# Patient Record
Sex: Female | Born: 1945 | Race: White | Hispanic: No | Marital: Married | Smoking: Never smoker
Health system: Southern US, Community
[De-identification: ages and names within clinical notes are randomized; demographics above are authoritative.]

## PROBLEM LIST (undated history)

## (undated) DIAGNOSIS — L409 Psoriasis, unspecified: Secondary | ICD-10-CM

## (undated) DIAGNOSIS — M1990 Unspecified osteoarthritis, unspecified site: Secondary | ICD-10-CM

## (undated) DIAGNOSIS — G2581 Restless legs syndrome: Secondary | ICD-10-CM

## (undated) DIAGNOSIS — I341 Nonrheumatic mitral (valve) prolapse: Secondary | ICD-10-CM

## (undated) DIAGNOSIS — K219 Gastro-esophageal reflux disease without esophagitis: Secondary | ICD-10-CM

## (undated) DIAGNOSIS — F419 Anxiety disorder, unspecified: Secondary | ICD-10-CM

## (undated) DIAGNOSIS — I10 Essential (primary) hypertension: Secondary | ICD-10-CM

## (undated) DIAGNOSIS — N289 Disorder of kidney and ureter, unspecified: Secondary | ICD-10-CM

## (undated) DIAGNOSIS — F329 Major depressive disorder, single episode, unspecified: Secondary | ICD-10-CM

## (undated) DIAGNOSIS — F32A Depression, unspecified: Secondary | ICD-10-CM

## (undated) HISTORY — PX: BACK SURGERY: SHX140

---

## 2011-10-09 DIAGNOSIS — I10 Essential (primary) hypertension: Secondary | ICD-10-CM | POA: Insufficient documentation

## 2016-03-12 DIAGNOSIS — G8929 Other chronic pain: Secondary | ICD-10-CM | POA: Insufficient documentation

## 2016-03-26 ENCOUNTER — Emergency Department
Admission: EM | Admit: 2016-03-26 | Discharge: 2016-03-26 | Disposition: A | Discharge status: Home / Self Care | Payer: Medicare Other | Source: Home / Self Care | Attending: Family Medicine | Admitting: Family Medicine

## 2016-03-26 ENCOUNTER — Encounter: Payer: Self-pay | Admitting: *Deleted

## 2016-03-26 DIAGNOSIS — J069 Acute upper respiratory infection, unspecified: Secondary | ICD-10-CM | POA: Diagnosis not present

## 2016-03-26 DIAGNOSIS — B9789 Other viral agents as the cause of diseases classified elsewhere: Secondary | ICD-10-CM

## 2016-03-26 HISTORY — DX: Nonrheumatic mitral (valve) prolapse: I34.1

## 2016-03-26 HISTORY — DX: Disorder of kidney and ureter, unspecified: N28.9

## 2016-03-26 HISTORY — DX: Unspecified osteoarthritis, unspecified site: M19.90

## 2016-03-26 HISTORY — DX: Restless legs syndrome: G25.81

## 2016-03-26 HISTORY — DX: Major depressive disorder, single episode, unspecified: F32.9

## 2016-03-26 HISTORY — DX: Depression, unspecified: F32.A

## 2016-03-26 HISTORY — DX: Essential (primary) hypertension: I10

## 2016-03-26 HISTORY — DX: Anxiety disorder, unspecified: F41.9

## 2016-03-26 HISTORY — DX: Gastro-esophageal reflux disease without esophagitis: K21.9

## 2016-03-26 HISTORY — DX: Psoriasis, unspecified: L40.9

## 2016-03-26 MED ORDER — DOXYCYCLINE HYCLATE 100 MG PO CAPS: 100.0000 mg | ORAL_CAPSULE | Freq: Two times a day (BID) | ORAL | 0 refills | Status: DC

## 2016-03-26 MED ORDER — BENZONATATE 200 MG PO CAPS: 200.0000 mg | ORAL_CAPSULE | Freq: Every day | ORAL | 0 refills | Status: AC

## 2016-03-26 MED ORDER — FLUTICASONE PROPIONATE 50 MCG/ACT NA SUSP: NASAL | 1 refills | Status: AC

## 2016-03-26 NOTE — ED Triage Notes (Signed)
Patient c/o 3 days of productive cough and nasal congestion. Taken Mucinex otc. Also c/o left groin and leg pain started 8 days ago. She initially went to the ER, x-rays shown arthritis. Given Tramadol. Pain improved, returned past 2 days.

## 2016-03-26 NOTE — ED Provider Notes (Signed)
[facility]    CSN: [id number] Arrival date & time: 03/26/16  1351     History   Chief Complaint Chief Complaint  Patient presents with  . Cough  . Leg Pain    HPI Stacy Gross is a 70 y.o. female.   Patient complains of four day history of typical cold-like symptoms developing over several days, including mild sore throat, sinus congestion,  fatigue, sweats, and cough. Yesterday her ears felt clogged, and last night her cough increased and became more productive. She states that she developed left hip pain one week ago and was evaluated at an ER.  She was prescribed tramadol which helped.  This morning she had recurrent left hip pain.   The history is provided by the patient.    Past Medical History:  Diagnosis Date  . Acid reflux   . Anxiety and depression   . Arthritis   . Hypertension   . Kidney disease   . Mitral valve prolapse   . Psoriasis   . RLS (restless legs syndrome)     There are no active problems to display for this patient.   History reviewed. No pertinent surgical history.  OB History    No data available       Home Medications    Prior to Admission medications   Medication Sig Start Date End Date Taking? Authorizing Provider  ALPRAZolam Prudy Feeler(XANAX) 0.5 MG tablet Take 0.5 mg by mouth at bedtime as needed for anxiety.   Yes Historical Provider, MD  amLODipine (NORVASC) 2.5 MG tablet Take 2.5 mg by mouth daily.   Yes Historical Provider, MD  ARIPiprazole (ABILIFY) 5 MG tablet Take 5 mg by mouth daily.   Yes Historical Provider, MD  buPROPion (WELLBUTRIN XL) 150 MG 24 hr tablet Take 150 mg by mouth daily.   Yes Historical Provider, MD  clindamycin (CLEOCIN) 150 MG capsule Take by mouth 3 (three) times daily.   Yes Historical Provider, MD  Clobetasol Prop Crea-Coal Tar (CLOBETA CREAM EX) Apply topically.   Yes Historical Provider, MD  dicyclomine (BENTYL) 20 MG tablet Take 20 mg by mouth every 6 (six) hours.   Yes Historical Provider,  MD  FERROUS SULFATE PO Take 240 mg by mouth.   Yes Historical Provider, MD  GABAPENTIN PO Take by mouth.   Yes Historical Provider, MD  omeprazole (PRILOSEC) 20 MG capsule Take 20 mg by mouth daily.   Yes Historical Provider, MD  ondansetron (ZOFRAN) 8 MG tablet Take by mouth every 8 (eight) hours as needed for nausea or vomiting.   Yes Historical Provider, MD  sertraline (ZOLOFT) 100 MG tablet Take 100 mg by mouth daily.   Yes Historical Provider, MD  benzonatate (TESSALON) 200 MG capsule Take 1 capsule (200 mg total) by mouth at bedtime. Take as needed for cough 03/26/16   Lattie HawStephen A Medardo Hassing, MD  doxycycline (VIBRAMYCIN) 100 MG capsule Take 1 capsule (100 mg total) by mouth 2 (two) times daily. Take with food (Rx void after 04/03/16) 03/26/16   Lattie HawStephen A Legion Discher, MD  fluticasone Aleda Grana(FLONASE) 50 MCG/ACT nasal spray Place two sprays in each nostril once daily 03/26/16   Lattie HawStephen A Doll Frazee, MD    Family History History reviewed. No pertinent family history.  Social History Social History  Substance Use Topics  . Smoking status: Never Smoker  . Smokeless tobacco: Never Used  . Alcohol use No     Allergies   Ceftriaxone; Ciprofloxacin; Codeine; Lisinopril; and Penicillins   Review of Systems  Review of Systems  + sore throat + cough No pleuritic pain No wheezing + nasal congestion + post-nasal drainage No sinus pain/pressure No itchy/red eyes ? earache No hemoptysis No SOB No fever, + chills/sweats No nausea No vomiting No abdominal pain No diarrhea No urinary symptoms No skin rash + left hip pain + fatigue + myalgias No headache Used OTC meds without relief    Physical Exam Triage Vital Signs ED Triage Vitals  Enc Vitals Group     BP 03/26/16 1458 125/67     Pulse Rate 03/26/16 1458 89     Resp 03/26/16 1458 16     Temp 03/26/16 1458 98.3 F (36.8 C)     Temp Source 03/26/16 1458 Oral     SpO2 03/26/16 1458 95 %     Weight 03/26/16 1459 128 lb (58.1 kg)     Height  --      Head Circumference --      Peak Flow --      Pain Score 03/26/16 1531 7     Pain Loc --      Pain Edu? --      Excl. in GC? --    No data found.   Updated Vital Signs BP 125/67 (BP Location: Left Arm)   Pulse 89   Temp 98.3 F (36.8 C) (Oral)   Resp 16   Wt 128 lb (58.1 kg)   SpO2 95%   Visual Acuity Right Eye Distance:   Left Eye Distance:   Bilateral Distance:    Right Eye Near:   Left Eye Near:    Bilateral Near:     Physical Exam Nursing notes and Vital Signs reviewed. Appearance:  Patient appears stated age, and in no acute distress Eyes:  Pupils are equal, round, and reactive to light and accomodation.  Extraocular movement is intact.  Conjunctivae are not inflamed  Ears:  Canals normal.  Tympanic membranes normal.  Nose:  Mildly congested turbinates.  No sinus tenderness.   Pharynx:  Normal Neck:  Supple.  Tender enlarged posterior/lateral nodes are palpated bilaterally  Lungs:  Clear to auscultation.  Breath sounds are equal.  Moving air well. Heart:  Regular rate and rhythm without murmurs, rubs, or gallops.  Abdomen:  Nontender without masses or hepatosplenomegaly.  Bowel sounds are present.  No CVA or flank tenderness.  Extremities:  No edema.  Left hip:  Relatively good range of motion.  Has pain with active hip flexion.  There is tenderness to palpation over the symphysis pubis. Skin:  No rash present.    UC Treatments / Results  Labs (all labs ordered are listed, but only abnormal results are displayed) Labs Reviewed - No data to display  EKG  EKG Interpretation None       Radiology No results found.  Procedures Procedures (including critical care time)  Medications Ordered in UC Medications - No data to display   Initial Impression / Assessment and Plan / UC Course  I have reviewed the triage vital signs and the nursing notes.  Pertinent labs & imaging results that were available during my care of the patient were reviewed by  me and considered in my medical decision making (see chart for details).  Clinical Course   There is no evidence of bacterial infection today.   Prescription written for Benzonatate [employer]) to take at bedtime for night-time cough.  Suspect left hip pain exacerbation indirectly related to her viral URI Take plain guaifenesin (1200mg  extended release  tabs such as Mucinex) twice daily, with plenty of water, for cough and congestion. Get adequate rest.   May use Afrin nasal spray (or generic oxymetazoline) twice daily for about 5 days and then discontinue.  Also recommend using saline nasal spray several times daily and saline nasal irrigation (AYR is a common brand).  Use Flonase nasal spray each morning after using Afrin nasal spray and saline nasal irrigation. Try warm salt water gargles for sore throat.  Stop all antihistamines for now, and other non-prescription cough/cold preparations. May continue Tramadol for left hip/leg pain. Begin Doxycycline if not improving about one week or if persistent fever develops (Given a prescription to hold, with an expiration date)  Follow-up with family doctor if not improving about10 days.      Final Clinical Impressions(s) / UC Diagnoses   Final diagnoses:  Viral URI with cough    New Prescriptions New Prescriptions   BENZONATATE (TESSALON) 200 MG CAPSULE    Take 1 capsule (200 mg total) by mouth at bedtime. Take as needed for cough   DOXYCYCLINE (VIBRAMYCIN) 100 MG CAPSULE    Take 1 capsule (100 mg total) by mouth 2 (two) times daily. Take with food (Rx void after 04/03/16)   FLUTICASONE (FLONASE) 50 MCG/ACT NASAL SPRAY    Place two sprays in each nostril once daily     Lattie HawStephen A Madiline Saffran, MD 03/28/16 1312

## 2016-03-26 NOTE — Discharge Instructions (Signed)
Take plain guaifenesin (1200mg  extended release tabs such as Mucinex) twice daily, with plenty of water, for cough and congestion. Get adequate rest.   May use Afrin nasal spray (or generic oxymetazoline) twice daily for about 5 days and then discontinue.  Also recommend using saline nasal spray several times daily and saline nasal irrigation (AYR is a common brand).  Use Flonase nasal spray each morning after using Afrin nasal spray and saline nasal irrigation. Try warm salt water gargles for sore throat.  Stop all antihistamines for now, and other non-prescription cough/cold preparations. May continue Tramadol for left hip/leg pain. Begin Doxycycline if not improving about one week or if persistent fever develops   Follow-up with family doctor if not improving about10 days.

## 2016-07-05 DIAGNOSIS — M47812 Spondylosis without myelopathy or radiculopathy, cervical region: Secondary | ICD-10-CM | POA: Insufficient documentation

## 2016-11-11 DIAGNOSIS — R9431 Abnormal electrocardiogram [ECG] [EKG]: Secondary | ICD-10-CM | POA: Insufficient documentation

## 2017-08-28 DIAGNOSIS — K5900 Constipation, unspecified: Secondary | ICD-10-CM | POA: Insufficient documentation

## 2019-06-02 ENCOUNTER — Other Ambulatory Visit: Payer: Self-pay

## 2019-06-02 ENCOUNTER — Encounter: Payer: Self-pay | Admitting: *Deleted

## 2019-06-02 ENCOUNTER — Emergency Department (INDEPENDENT_AMBULATORY_CARE_PROVIDER_SITE_OTHER)
Admission: EM | Admit: 2019-06-02 | Discharge: 2019-06-02 | Disposition: A | Discharge status: Home / Self Care | Payer: Medicare Other | Source: Home / Self Care

## 2019-06-02 ENCOUNTER — Emergency Department (INDEPENDENT_AMBULATORY_CARE_PROVIDER_SITE_OTHER): Payer: Medicare Other

## 2019-06-02 DIAGNOSIS — M25531 Pain in right wrist: Secondary | ICD-10-CM | POA: Diagnosis not present

## 2019-06-02 DIAGNOSIS — M79641 Pain in right hand: Secondary | ICD-10-CM | POA: Diagnosis not present

## 2019-06-02 MED ORDER — PREDNISONE 10 MG PO TABS: ORAL_TABLET | ORAL | 0 refills | Status: DC

## 2019-06-02 MED ORDER — HYDROCODONE-ACETAMINOPHEN 5-325 MG PO TABS: 1.0000 | ORAL_TABLET | ORAL | 0 refills | Status: AC | PRN

## 2019-06-02 MED ORDER — SULFAMETHOXAZOLE-TRIMETHOPRIM 800-160 MG PO TABS: 1.0000 | ORAL_TABLET | Freq: Two times a day (BID) | ORAL | 0 refills | Status: AC

## 2019-06-02 NOTE — ED Triage Notes (Addendum)
Patient c/o right wrist pain x 3 days without injury. Hand, wrist and fingers are swollen and warm to touch. Wearing a brace.

## 2019-06-02 NOTE — Discharge Instructions (Signed)
See Dr. Karie Schwalbe tomorrow or return here for recheck.

## 2019-06-03 NOTE — ED Provider Notes (Signed)
[facility]    CSN: [id number] Arrival date & time: 06/02/19  1532      History   Chief Complaint Chief Complaint  Patient presents with  . Wrist Pain    right    HPI Stacy Gross is a 74 y.o. female.   The history is provided by the patient. No language interpreter was used.  Wrist Pain This is a new problem. The current episode started yesterday. The problem occurs constantly. The problem has been gradually worsening. Nothing aggravates the symptoms. Nothing relieves the symptoms. She has tried nothing for the symptoms. The treatment provided no relief.  Pt complains of swelling and pain to her right wrist.  Pt reports pain is worse with movement,  Pt has had gout in other areas in the past   Past Medical History:  Diagnosis Date  . Acid reflux   . Anxiety and depression   . Arthritis   . Hypertension   . Kidney disease   . Mitral valve prolapse   . Psoriasis   . RLS (restless legs syndrome)     There are no problems to display for this patient.   History reviewed. No pertinent surgical history.  OB History   No obstetric history on file.      Home Medications    Prior to Admission medications   Medication Sig Start Date End Date Taking? Authorizing Provider  ALPRAZolam Prudy Feeler) 0.5 MG tablet Take 0.5 mg by mouth at bedtime as needed for anxiety.    [provider]  amLODipine (NORVASC) 2.5 MG tablet Take 2.5 mg by mouth daily.    [provider]  ARIPiprazole (ABILIFY) 5 MG tablet Take 5 mg by mouth daily.    [provider]  benzonatate (TESSALON) 200 MG capsule Take 1 capsule (200 mg total) by mouth at bedtime. Take as needed for cough 03/26/16   Lattie Haw, MD  buPROPion (WELLBUTRIN XL) 150 MG 24 hr tablet Take 150 mg by mouth daily.    [provider]  clindamycin (CLEOCIN) 150 MG capsule Take by mouth 3 (three) times daily.    [provider]  Clobetasol Prop Crea-Coal Tar (CLOBETA CREAM EX)  Apply topically.    [provider]  dicyclomine (BENTYL) 20 MG tablet Take 20 mg by mouth every 6 (six) hours.    [provider]  doxycycline (VIBRAMYCIN) 100 MG capsule Take 1 capsule (100 mg total) by mouth 2 (two) times daily. Take with food (Rx void after 04/03/16) 03/26/16   Lattie Haw, MD  FERROUS SULFATE PO Take 240 mg by mouth.    [provider]  fluticasone Aleda Grana) 50 MCG/ACT nasal spray Place two sprays in each nostril once daily 03/26/16   Lattie Haw, MD  GABAPENTIN PO Take by mouth.    [provider]  HYDROcodone-acetaminophen (NORCO/VICODIN) 5-325 MG tablet Take 1 tablet by mouth every 4 (four) hours as needed for moderate pain. 06/02/19 06/01/20  Elson Areas, PA-C  omeprazole (PRILOSEC) 20 MG capsule Take 20 mg by mouth daily.    [provider]  ondansetron (ZOFRAN) 8 MG tablet Take by mouth every 8 (eight) hours as needed for nausea or vomiting.    [provider]  predniSONE (DELTASONE) 10 MG tablet 6,5,4,3,2,1 taper 06/02/19   Elson Areas, PA-C  sertraline (ZOLOFT) 100 MG tablet Take 100 mg by mouth daily.    [provider]  sulfamethoxazole-trimethoprim (BACTRIM DS) 800-160 MG tablet Take 1 tablet  by mouth 2 (two) times daily. 06/02/19   Elson Areas, PA-C    Family History History reviewed. No pertinent family history.  Social History Social History   Tobacco Use  . Smoking status: Never Smoker  . Smokeless tobacco: Never Used  Substance Use Topics  . Alcohol use: No  . Drug use: No     Allergies   Ceftriaxone, Ciprofloxacin, Codeine, Lisinopril, and Penicillins   Review of Systems Review of Systems  Musculoskeletal: Positive for joint swelling.  All other systems reviewed and are negative.    Physical Exam Triage Vital Signs ED Triage Vitals  Enc Vitals Group     BP 06/02/19 1601 139/70     Pulse Rate 06/02/19 1601 87     Resp 06/02/19 1601 14     Temp 06/02/19  1601 98.5 F (36.9 C)     Temp Source 06/02/19 1601 Oral     SpO2 06/02/19 1601 96 %     Weight 06/02/19 1602 132 lb (59.9 kg)     Height --      Head Circumference --      Peak Flow --      Pain Score 06/02/19 1602 10     Pain Loc --      Pain Edu? --      Excl. in GC? --    No data found.  Updated Vital Signs BP 139/70 (BP Location: Left Arm)   Pulse 87   Temp 98.5 F (36.9 C) (Oral)   Resp 14   Wt 59.9 kg   SpO2 96%   Visual Acuity Right Eye Distance:   Left Eye Distance:   Bilateral Distance:    Right Eye Near:   Left Eye Near:    Bilateral Near:     Physical Exam Vitals reviewed.  Musculoskeletal:        General: Swelling and tenderness present.     Comments: Swollen tender right wrist, pain with movement, nv and ns intact   Skin:    General: Skin is warm.  Neurological:     General: No focal deficit present.     Mental Status: She is alert.  Psychiatric:        Mood and Affect: Mood normal.      UC Treatments / Results  Labs (all labs ordered are listed, but only abnormal results are displayed) Labs Reviewed - No data to display  EKG   Radiology DG Hand Complete Right  Result Date: 06/02/2019 CLINICAL DATA:  Atraumatic right hand pain and swelling. EXAM: RIGHT HAND - COMPLETE 3+ VIEW COMPARISON:  None. FINDINGS: Normal visualized carpal bones. Normal first metacarpus. Normal second through fifth metacarpi. Chronic deformities are seen involving the distal aspect of the middle phalanx of the third right finger and base of the distal phalanx of the third right finger. The remaining phalanges are normal in appearance. There is no demonstrated Fracture. Normal carpal articulations. Mild degenerative changes seen involving the carpometacarpal [facility]) articulation and interphalangeal joint of the right thumb. Normal metacarpophalangeal (MCP) joint of the right thumb. Normal second through fifth carpometacarpal (CMC) joints. Normal second through fifth  metacarpophalangeal (MCP) joints. Moderate severity degenerative changes seen involving the proximal interphalangeal (PIP) and distal interphalangeal (DIP) joints of the second through fifth fingers. Mild dorsal soft tissue swelling is noted. IMPRESSION: 1. Chronic and degenerative changes, as described above, without evidence of acute osseous abnormality. Electronically Signed   By: Aram Candela M.D.   On: 06/02/2019 17:05  Procedures Procedures (including critical care time)  Medications Ordered in UC Medications - No data to display  Initial Impression / Assessment and Plan / UC Course  I have reviewed the triage vital signs and the nursing notes.  Pertinent labs & imaging results that were available during my care of the patient were reviewed by me and considered in my medical decision making (see chart for details).     MDM  I don't think pt has cellulitis, I suspect gout.  I will treat with prednisone and bactrim.  Pt is advised she needs to be rechecked in 24 hours. I will refer to Dr. Darene Lamer. If he can not see I will recheck here.   Final Clinical Impressions(s) / UC Diagnoses   Final diagnoses:  Acute pain of right wrist     Discharge Instructions     See Dr. Darene Lamer tomorrow or return here for recheck.    ED Prescriptions    Medication Sig Dispense Auth. Provider   predniSONE (DELTASONE) 10 MG tablet 6,5,4,3,2,1 taper 21 tablet Caryl Ada K, PA-C   sulfamethoxazole-trimethoprim (BACTRIM DS) 800-160 MG tablet Take 1 tablet by mouth 2 (two) times daily. 20 tablet Caius Silbernagel K, [place]   HYDROcodone-acetaminophen (NORCO/VICODIN) 5-325 MG tablet Take 1 tablet by mouth every 4 (four) hours as needed for moderate pain. 12 tablet Fransico Meadow, [place]     I have reviewed the PDMP during this encounter.  An After Visit Summary was printed and given to the patient.    Fransico Meadow, [place] 06/03/19 1044

## 2020-02-08 DIAGNOSIS — M1990 Unspecified osteoarthritis, unspecified site: Secondary | ICD-10-CM | POA: Insufficient documentation

## 2020-05-03 ENCOUNTER — Emergency Department (INDEPENDENT_AMBULATORY_CARE_PROVIDER_SITE_OTHER)
Admission: EM | Admit: 2020-05-03 | Discharge: 2020-05-03 | Disposition: A | Discharge status: Home / Self Care | Payer: Medicare Other | Source: Home / Self Care

## 2020-05-03 ENCOUNTER — Other Ambulatory Visit: Payer: Self-pay

## 2020-05-03 DIAGNOSIS — R059 Cough, unspecified: Secondary | ICD-10-CM

## 2020-05-03 DIAGNOSIS — J320 Chronic maxillary sinusitis: Secondary | ICD-10-CM

## 2020-05-03 MED ORDER — DOXYCYCLINE HYCLATE 100 MG PO CAPS: 100.0000 mg | ORAL_CAPSULE | Freq: Two times a day (BID) | ORAL | 0 refills | Status: AC

## 2020-05-03 NOTE — ED Provider Notes (Signed)
[name]    CSN: [id number] Arrival date & time: 05/03/20  1516      History   Chief Complaint Chief Complaint  Patient presents with  . Sore Throat    HPI Stacy Gross is a 74 y.o. female.   Patient complains of sore throat.  Also has some cough productive of yellow-green sputum.  Vaccinated for both Covid and flu.  Had a rapid Covid test 3 days ago at the drugstore which was negative.  HPI  Past Medical History:  Diagnosis Date  . Acid reflux   . Anxiety and depression   . Arthritis   . Hypertension   . Kidney disease   . Mitral valve prolapse   . Psoriasis   . RLS (restless legs syndrome)     There are no problems to display for this patient.   History reviewed. No pertinent surgical history.  OB History   No obstetric history on file.      Home Medications    Prior to Admission medications   Medication Sig Start Date End Date Taking? Authorizing Provider  ALPRAZolam Prudy Feeler) 0.5 MG tablet Take 0.5 mg by mouth at bedtime as needed for anxiety.    [provider]  amLODipine (NORVASC) 2.5 MG tablet Take 2.5 mg by mouth daily.    [provider]  ARIPiprazole (ABILIFY) 5 MG tablet Take 5 mg by mouth daily.    [provider]  benzonatate (TESSALON) 200 MG capsule Take 1 capsule (200 mg total) by mouth at bedtime. Take as needed for cough 03/26/16   Lattie Haw, MD  buPROPion (WELLBUTRIN XL) 150 MG 24 hr tablet Take 150 mg by mouth daily.    [provider]  clindamycin (CLEOCIN) 150 MG capsule Take by mouth 3 (three) times daily.    [provider]  Clobetasol Prop Crea-Coal Tar (CLOBETA CREAM EX) Apply topically.    [provider]  dicyclomine (BENTYL) 20 MG tablet Take 20 mg by mouth every 6 (six) hours.    [provider]  doxycycline (VIBRAMYCIN) 100 MG capsule Take 1 capsule (100 mg total) by mouth 2 (two) times daily. Take with food (Rx void after 04/03/16) 03/26/16    Lattie Haw, MD  FERROUS SULFATE PO Take 240 mg by mouth.    [provider]  fluticasone Aleda Grana) 50 MCG/ACT nasal spray Place two sprays in each nostril once daily 03/26/16   Lattie Haw, MD  GABAPENTIN PO Take by mouth.    [provider]  HYDROcodone-acetaminophen (NORCO/VICODIN) 5-325 MG tablet Take 1 tablet by mouth every 4 (four) hours as needed for moderate pain. 06/02/19 06/01/20  Elson Areas, PA-C  omeprazole (PRILOSEC) 20 MG capsule Take 20 mg by mouth daily.    [provider]  ondansetron (ZOFRAN) 8 MG tablet Take by mouth every 8 (eight) hours as needed for nausea or vomiting.    [provider]  predniSONE (DELTASONE) 10 MG tablet 6,5,4,3,2,1 taper 06/02/19   Elson Areas, PA-C  sertraline (ZOLOFT) 100 MG tablet Take 100 mg by mouth daily.    [provider]  sulfamethoxazole-trimethoprim (BACTRIM DS) 800-160 MG tablet Take 1 tablet by mouth 2 (two) times daily. 06/02/19   Elson Areas, PA-C    Family History History reviewed. No pertinent family history.  Social History Social History   Tobacco Use  . Smoking status: Never Smoker  . Smokeless tobacco: Never Used  Substance Use Topics  . Alcohol  use: No  . Drug use: No     Allergies   Ceftriaxone, Ciprofloxacin, Codeine, Lisinopril, and Penicillins   Review of Systems Review of Systems  HENT: Positive for postnasal drip and sore throat.   Respiratory: Positive for cough.   All other systems reviewed and are negative.    Physical Exam Triage Vital Signs ED Triage Vitals  Enc Vitals Group     BP 05/03/20 1556 (!) 147/71     Pulse Rate 05/03/20 1556 74     Resp 05/03/20 1556 14     Temp 05/03/20 1556 98.3 F (36.8 C)     Temp Source 05/03/20 1556 Oral     SpO2 05/03/20 1556 95 %     Weight 05/03/20 1555 135 lb (61.2 kg)     Height 05/03/20 1555 5\' 1"  (1.549 m)     Head Circumference --      Peak Flow --      Pain Score 05/03/20 1554 0      Pain Loc --      Pain Edu? --      Excl. in GC? --    No data found.  Updated Vital Signs BP (!) 147/71 (BP Location: Left Arm)   Pulse 74   Temp 98.3 F (36.8 C) (Oral)   Resp 14   Ht 5\' 1"  (1.549 m)   Wt 61.2 kg   SpO2 95%   BMI 25.51 kg/m   Visual Acuity Right Eye Distance:   Left Eye Distance:   Bilateral Distance:    Right Eye Near:   Left Eye Near:    Bilateral Near:     Physical Exam Vitals and nursing note reviewed.  Constitutional:      Appearance: She is well-developed.  HENT:     Head: Normocephalic.     Right Ear: Tympanic membrane normal.     Left Ear: Tympanic membrane normal.     Mouth/Throat:     Mouth: Mucous membranes are moist.     Pharynx: Oropharyngeal exudate and posterior oropharyngeal erythema present.  Cardiovascular:     Rate and Rhythm: Normal rate and regular rhythm.  Pulmonary:     Effort: Pulmonary effort is normal.     Breath sounds: Normal breath sounds.  Neurological:     General: No focal deficit present.     Mental Status: She is alert.      UC Treatments / Results  Labs (all labs ordered are listed, but only abnormal results are displayed) Labs Reviewed - No data to display  EKG   Radiology No results found.  Procedures Procedures (including critical care time)  Medications Ordered in UC Medications - No data to display  Initial Impression / Assessment and Plan / UC Course  I have reviewed the triage vital signs and the nursing notes.  Pertinent labs & imaging results that were available during my care of the patient were reviewed by me and considered in my medical decision making (see chart for details).     Sinusitis.  Will check for Covid again per patient request Final Clinical Impressions(s) / UC Diagnoses   Final diagnoses:  None   Discharge Instructions   None    ED Prescriptions    None     PDMP not reviewed this encounter.   05/05/20, MD 05/03/20 [phone]

## 2020-05-03 NOTE — ED Triage Notes (Signed)
Patient presents to Urgent Care with complaints of head "fullness", sore throat and cough since 12/27. Patient reports cough syrup otc and cough drops. Vaccinated for both covid and flu.

## 2020-05-05 LAB — SARS-COV-2 RNA,(COVID-19) QUALITATIVE NAAT: SARS CoV2 RNA: NOT DETECTED

## 2020-06-06 ENCOUNTER — Other Ambulatory Visit: Payer: Self-pay

## 2020-06-06 ENCOUNTER — Encounter: Payer: Self-pay | Admitting: Emergency Medicine

## 2020-06-06 ENCOUNTER — Emergency Department (INDEPENDENT_AMBULATORY_CARE_PROVIDER_SITE_OTHER)
Admission: EM | Admit: 2020-06-06 | Discharge: 2020-06-06 | Disposition: A | Discharge status: Home / Self Care | Payer: Medicare Other | Source: Home / Self Care

## 2020-06-06 DIAGNOSIS — M436 Torticollis: Secondary | ICD-10-CM | POA: Diagnosis not present

## 2020-06-06 MED ORDER — DEXAMETHASONE SODIUM PHOSPHATE 10 MG/ML IJ SOLN
10.0000 mg | Freq: Once | INTRAMUSCULAR | Status: AC
  Administered 2020-06-06: 10 mg via INTRAMUSCULAR

## 2020-06-06 MED ORDER — PREDNISONE 10 MG PO TABS: 20.0000 mg | ORAL_TABLET | Freq: Every day | ORAL | 0 refills | Status: AC

## 2020-06-06 NOTE — ED Triage Notes (Signed)
Pain to left side of neck - hurts to move it since Sunday Denies injury  Soft collar in place - started wearing last night Denies any numbness or tingling

## 2020-06-06 NOTE — Discharge Instructions (Signed)
-  You received a steroid injection here in clinic today.  Start your oral prednisone tomorrow morning with breakfast take 20 mg daily for the next 5 days.  Recommend applications of heat to help with your neck pain.  You also can take Tylenol 500 mg every 6 hours as needed for pain while taking this steroids.

## 2020-06-06 NOTE — ED Provider Notes (Signed)
[facility]    CSN: [id number] Arrival date & time: 06/06/20  1111      History   Chief Complaint Chief Complaint  Patient presents with  . Neck Pain    left    HPI Stacy Gross is a 75 y.o. female.   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  Patient has a history of cervical disc spondylopathy and lumbar stenosis.  She has been followed both by orthopedics and by spine specialist.  She has not taken anything for pain.  She has placed herself in a neck collar.  She reports having problems taking muscle relaxers as they are very sedated.  She has stage III kidney disease and is unable to take NSAIDs due to this reason and history of a prior questionable GI bleed. Current neck pain is localized to the left portion of her neck and she is unable to perform full ranges of motions due to pain.  She reports pain started out very mild with neck stiffness and is now related to pain with any sudden movement. Past Medical History:  Diagnosis Date  . Acid reflux   . Anxiety and depression   . Arthritis   . Hypertension   . Kidney disease   . Mitral valve prolapse   . Psoriasis   . RLS (restless legs syndrome)     Patient Active Problem List   Diagnosis Date Noted  . Osteoarthritis 02/08/2020  . Constipation 08/28/2017  . Abnormal ECG 11/11/2016  . 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 Spondylosis of cervical region without myelopathy or radiculopathy 07/05/2016  . Chronic right shoulder pain 03/12/2016  . Essential hypertension 10/09/2011    History reviewed. No pertinent surgical history.  OB History   No obstetric history on file.      Home Medications    Prior to Admission medications   Medication Sig Start Date End Date Taking? Authorizing Provider  hydrochlorothiazide (HYDRODIURIL) 25 MG tablet TAKE 1  TABLET(25 MG) BY MOUTH EVERY DAY IN THE MORNING 12/10/19  Yes [provider]  losartan (COZAAR) 25 MG tablet Take 1 tablet by mouth 2 (two) times daily. 12/10/19  Yes [provider]  sertraline (ZOLOFT) 100 MG tablet Take 100 mg by mouth daily.   Yes [provider]  ALPRAZolam Prudy Feeler) 0.5 MG tablet Take 0.5 mg by mouth at bedtime as needed for anxiety.    [provider]  amLODipine (NORVASC) 2.5 MG tablet Take 2.5 mg by mouth daily. Patient not taking: Reported on 06/06/2020    [provider]  ARIPiprazole (ABILIFY) 5 MG tablet Take 5 mg by mouth daily.    [provider]  aspirin 81 MG EC tablet Take by mouth.    [provider]  benzonatate (TESSALON) 200 MG capsule Take 1 capsule (200 mg total) by mouth at bedtime. Take as needed for cough 03/26/16   Lattie Haw, MD  buPROPion (WELLBUTRIN XL) 150 MG 24 hr tablet Take 150 mg by mouth daily.    [provider]  clindamycin (CLEOCIN) 150 MG capsule Take by mouth 3 (three) times daily. Patient not taking: Reported on 06/06/2020    [provider]  Clobetasol Prop Crea-Coal Tar (CLOBETA CREAM EX) Apply topically.    [provider]  Cyanocobalamin 5000 MCG TBDP Take by mouth.    [provider]  dicyclomine (BENTYL) 20 MG tablet Take 20 mg by mouth every 6 (six) hours.    [provider]  doxycycline (VIBRAMYCIN) 100 MG capsule Take 1 capsule (100 mg total) by mouth 2 (two) times daily. Take with food (Rx void after 04/03/16) 05/03/20   Frederica Kuster, MD  FERROUS SULFATE PO Take 240 mg by mouth.    [provider]  fluticasone Aleda Grana) 50 MCG/ACT nasal spray Place two sprays in each nostril once daily 03/26/16   Lattie Haw, MD  GABAPENTIN PO Take by mouth.    [provider]  omeprazole (PRILOSEC) 20 MG capsule Take 20 mg by mouth daily.    [provider]  ondansetron (ZOFRAN) 8 MG tablet Take by mouth  every 8 (eight) hours as needed for nausea or vomiting. Patient not taking: Reported on 06/06/2020    [provider]  predniSONE (DELTASONE) 10 MG tablet 6,5,4,3,2,1 taper Patient not taking: Reported on 06/06/2020 06/02/19   Elson Areas, PA-C  sulfamethoxazole-trimethoprim (BACTRIM DS) 800-160 MG tablet Take 1 tablet by mouth 2 (two) times daily. Patient not taking: Reported on 06/06/2020 06/02/19   Osie Cheeks    Family History Family History  Problem Relation Age of Onset  . Stroke Mother   . Healthy Father   . Healthy Sister   . Healthy Brother   . Diabetes Sister   . Healthy Sister     Social History Social History   Tobacco Use  . Smoking status: Never Smoker  . Smokeless tobacco: Never Used  Substance Use Topics  . Alcohol use: No  . Drug use: No     Allergies   Amlodipine, Ceftriaxone, Ciprofloxacin, Codeine, Lisinopril, and Penicillins   Review of Systems Review of Systems Pertinent negatives listed in HPI   Physical Exam Triage Vital Signs ED Triage Vitals  Enc Vitals Group     BP 06/06/20 1138 (!) 179/82     Pulse Rate 06/06/20 1138 93     Resp 06/06/20 1138 16     Temp 06/06/20 1138 99.4 F (37.4 C)     Temp Source 06/06/20 1138 Oral     SpO2 06/06/20 1138 96 %     Weight 06/06/20 1143 134 lb 7.7 oz (61 kg)     Height 06/06/20 1143 5\' 1"  (1.549 m)     Head Circumference --      Peak Flow --      Pain Score 06/06/20 1142 9     Pain Loc --      Pain Edu? --      Excl. in GC? --    No data found.  Updated Vital Signs BP (!) 179/82 (BP Location: Right Arm)   Pulse 93   Temp 99.4 F (37.4 C) (Oral)   Resp 16   Ht 5\' 1"  (1.549 m)   Wt 134 lb 7.7 oz (61 kg)   SpO2 96%   BMI 25.41 kg/m   Visual Acuity Right Eye Distance:   Left Eye Distance:   Bilateral Distance:    Right Eye Near:   Left Eye Near:    Bilateral Near:     Physical Exam Constitutional:      Comments: Chronically ill-appearing  Eyes:      Extraocular Movements: Extraocular movements intact.     Pupils: Pupils are equal, round, and reactive to light.  Cardiovascular:     Rate and Rhythm: Normal rate.  Pulmonary:     Effort: Pulmonary effort  is normal.     Breath sounds: Normal breath sounds.  Musculoskeletal:     Cervical back: Rigidity and tenderness present.  Lymphadenopathy:     Cervical: No cervical adenopathy.  Skin:    Capillary Refill: Capillary refill takes less than 2 seconds.  Neurological:     Mental Status: She is oriented to person, place, and time.      UC Treatments / Results  Labs (all labs ordered are listed, but only abnormal results are displayed) Labs Reviewed - No data to display  EKG   Radiology No results found.  Procedures Procedures (including critical care time)  Medications Ordered in UC Medications - No data to display  Initial Impression / Assessment and Plan / UC Course  I have reviewed the triage vital signs and the nursing notes.  Pertinent labs & imaging results that were available during my care of the patient were reviewed by me and considered in my medical decision making (see chart for details).     Acute torticollis, patient with a history of chronic degenerative neck and degenerative spine disease presents today with 2 days of neck stiffness and pain.  Patient also has stage III renal disease and a history of a mild GI bleeding therefore unable to give any NSAIDs. Decadron IM given here in clinic.  Patient will start oral low-dose of prednisone tomorrow.  Patient cannot tolerate any muscle relaxers therefore advised to take Tylenol along with the prednisone as needed for pain as well as warm heat applications.  Patient advised to follow-up with [facility] who was previously following her if symptoms worsen or do not improve Final Clinical Impressions(s) / UC Diagnoses   Final diagnoses:  Torticollis, acute     Discharge Instructions     -You received a  steroid injection here in clinic today.  Start your oral prednisone tomorrow morning with breakfast take 20 mg daily for the next 5 days.  Recommend applications of heat to help with your neck pain.  You also can take Tylenol 500 mg every 6 hours as needed for pain while taking this steroids.    ED Prescriptions    Medication Sig Dispense Auth. Provider   predniSONE (DELTASONE) 10 MG tablet Take 2 tablets (20 mg total) by mouth daily with breakfast for 5 days. 10 tablet Bing Neighbors, FNP     PDMP not reviewed this encounter.   Bing Neighbors, FNP 06/06/20 1224

## 2021-01-09 ENCOUNTER — Other Ambulatory Visit: Payer: Self-pay

## 2021-01-09 ENCOUNTER — Emergency Department
Admission: RE | Admit: 2021-01-09 | Discharge: 2021-01-09 | Disposition: A | Discharge status: Home / Self Care | Payer: Medicare Other | Source: Ambulatory Visit

## 2021-01-09 VITALS — BP 136/78 | HR 85 | Temp 98.5°F | Resp 20 | Ht 61.5 in | Wt 128.0 lb

## 2021-01-09 DIAGNOSIS — U071 COVID-19: Secondary | ICD-10-CM

## 2021-01-09 LAB — POC SARS CORONAVIRUS 2 AG -  ED: SARS Coronavirus 2 Ag: POSITIVE — AB

## 2021-01-09 MED ORDER — MOLNUPIRAVIR EUA 200MG CAPSULE: 4.0000 | ORAL_CAPSULE | Freq: Two times a day (BID) | ORAL | 0 refills | Status: AC

## 2021-01-09 NOTE — ED Triage Notes (Signed)
Pt presents to Urgent Care with c/o nasal congestion, R otalgia, and generalized body aches x 3 days. Has not done a home COVID test; vaccinated.

## 2021-01-09 NOTE — Discharge Instructions (Addendum)
Advised/instructed patient to take medication as directed with food to completion.  Encourage patient increase daily water intake while taking this medication.  Advised/encouraged patient to self quarantine for 10 days or until Saturday, 01/20/2021.  Advised patient if asymptomatic may self quarantine for 5 days or until Monday, 01/15/2021.

## 2021-01-09 NOTE — ED Provider Notes (Signed)
[name]    CSN: [id number] Arrival date & time: 01/09/21  1550      History   Chief Complaint Chief Complaint  Patient presents with   Nasal Congestion   Otalgia   Generalized Body Aches    HPI Stacy Gross is a 75 y.o. female.   HPI 75 year old female presents with nasal congestion, right otalgia and generalized body aches x 3 days.  Patient is vaccinated for COVID-19.  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      Home Medications    Prior to Admission medications   Medication Sig Start Date End Date Taking? Authorizing Provider  molnupiravir EUA 200 mg CAPS Take 4 capsules (800 mg total) by mouth 2 (two) times daily for 5 days. 01/09/21 01/14/21 Yes Trevor Iha, FNP  ALPRAZolam Prudy Feeler) 0.5 MG tablet Take 0.5 mg by mouth at bedtime as needed for anxiety.    [provider]  amLODipine (NORVASC) 2.5 MG tablet Take 2.5 mg by mouth daily. Patient not taking: No sig reported    [provider]  ARIPiprazole (ABILIFY) 5 MG tablet Take 5 mg by mouth daily.    [provider]  aspirin 81 MG EC tablet Take by mouth.    [provider]  benzonatate (TESSALON) 200 MG capsule Take 1 capsule (200 mg total) by mouth at bedtime. Take as needed for cough 03/26/16   Lattie Haw, MD  buPROPion (WELLBUTRIN XL)  150 MG 24 hr tablet Take 150 mg by mouth daily.    [provider]  clindamycin (CLEOCIN) 150 MG capsule Take by mouth 3 (three) times daily. Patient not taking: No sig reported    [provider]  Clobetasol Prop Crea-Coal Tar (CLOBETA CREAM EX) Apply topically.    [provider]  Cyanocobalamin 5000 MCG TBDP Take by mouth.    [provider]  dicyclomine (BENTYL) 20 MG tablet Take 20 mg by mouth every 6 (six) hours.    [provider]  doxycycline (VIBRAMYCIN) 100 MG capsule Take 1 capsule (100 mg total) by mouth 2 (two) times daily. Take with food (Rx void after 04/03/16) 05/03/20   Frederica Kuster, MD  FERROUS SULFATE PO Take 240 mg by mouth.    [provider]  fluticasone Aleda Grana) 50 MCG/ACT nasal spray Place two sprays in each nostril once daily 03/26/16   Lattie Haw, MD  GABAPENTIN PO Take by mouth.    [provider]  hydrochlorothiazide (HYDRODIURIL) 25 MG tablet TAKE 1 TABLET(25 MG) BY MOUTH EVERY DAY IN THE MORNING 12/10/19   [provider]  losartan (COZAAR) 25 MG tablet Take 1 tablet by mouth 2 (two) times daily. 12/10/19   [provider]  omeprazole (PRILOSEC) 20 MG capsule Take  20 mg by mouth daily.    [provider]  ondansetron (ZOFRAN) 8 MG tablet Take by mouth every 8 (eight) hours as needed for nausea or vomiting. Patient not taking: No sig reported    [provider]  sertraline (ZOLOFT) 100 MG tablet Take 100 mg by mouth daily.    [provider]  sulfamethoxazole-trimethoprim (BACTRIM DS) 800-160 MG tablet Take 1 tablet by mouth 2 (two) times daily. Patient not taking: No sig reported 06/02/19   Elson Areas, PA-C    Family History Family History  Problem Relation Age of Onset   Stroke Mother    Suicidality Father    Healthy Sister    Diabetes Sister    Healthy Sister    Healthy Brother     Social History Social History   Tobacco Use    Smoking status: Never   Smokeless tobacco: Never  Vaping Use   Vaping Use: Never used  Substance Use Topics   Alcohol use: No   Drug use: No     Allergies   Amlodipine, Ceftriaxone, Ciprofloxacin, Codeine, Lisinopril, and Penicillins   Review of Systems Review of Systems  HENT:  Positive for congestion and ear pain.   Musculoskeletal:  Positive for myalgias.  All other systems reviewed and are negative.   Physical Exam Triage Vital Signs ED Triage Vitals  Enc Vitals Group     BP 01/09/21 1616 136/78     Pulse Rate 01/09/21 1616 85     Resp 01/09/21 1616 20     Temp 01/09/21 1616 98.5 F (36.9 C)     Temp Source 01/09/21 1616 Oral     SpO2 01/09/21 1616 97 %     Weight 01/09/21 1610 128 lb (58.1 kg)     Height 01/09/21 1610 5' 1.5" (1.562 m)     Head Circumference --      Peak Flow --      Pain Score 01/09/21 1609 0     Pain Loc --      Pain Edu? --      Excl. in GC? --    No data found.  Updated Vital Signs BP 136/78 (BP Location: Right Arm)   Pulse 85   Temp 98.5 F (36.9 C) (Oral)   Resp 20   Ht 5' 1.5" (1.562 m)   Wt 128 lb (58.1 kg)   SpO2 97%   BMI 23.79 kg/m      Physical Exam Vitals and nursing note reviewed.  Constitutional:      General: She is not in acute distress.    Appearance: Normal appearance. She is normal weight. She is ill-appearing.  HENT:     Head: Normocephalic and atraumatic.     Right Ear: Tympanic membrane, ear canal and external ear normal.     Left Ear: Tympanic membrane, ear canal and external ear normal.     Mouth/Throat:     Mouth: Mucous membranes are moist.     Pharynx: Oropharynx is clear.  Eyes:     Extraocular Movements: Extraocular movements intact.     Conjunctiva/sclera: Conjunctivae normal.     Pupils: Pupils are equal, round, and reactive to light.  Cardiovascular:     Rate and Rhythm: Normal rate and regular rhythm.     Pulses: Normal pulses.     Heart sounds: Normal heart sounds.  Pulmonary:      Effort: Pulmonary effort is normal.     Breath sounds: Normal breath sounds. No stridor.  No wheezing, rhonchi or rales.  Musculoskeletal:        General: Normal range of motion.     Cervical back: Normal range of motion and neck supple. Tenderness present.  Lymphadenopathy:     Cervical: Cervical adenopathy present.  Skin:    General: Skin is warm and dry.  Neurological:     General: No focal deficit present.     Mental Status: She is alert and oriented to person, place, and time. Mental status is at baseline.  Psychiatric:        Mood and Affect: Mood normal.        Behavior: Behavior normal.        Thought Content: Thought content normal.     UC Treatments / Results  Labs (all labs ordered are listed, but only abnormal results are displayed) Labs Reviewed  POC SARS CORONAVIRUS 2 AG -  ED - Abnormal; Notable for the following components:      Result Value   SARS Coronavirus 2 Ag Positive (*)    All other components within normal limits    EKG   Radiology No results found.  Procedures Procedures (including critical care time)  Medications Ordered in UC Medications - No data to display  Initial Impression / Assessment and Plan / UC Course  I have reviewed the triage vital signs and the nursing notes.  Pertinent labs & imaging results that were available during my care of the patient were reviewed by me and considered in my medical decision making (see chart for details).     MDM: 1.  COVID-19-Rx'd Molnupiravir; Advised/instructed patient to take medication as directed with food to completion.  Encourage patient increase daily water intake while taking this medication.  Advised/encouraged patient to self quarantine for 10 days or until Saturday, 01/20/2021.  Advised patient if asymptomatic may self quarantine for 5 days or until Monday, 01/15/2021.  Patient discharged home, hemodynamically stable. Final Clinical Impressions(s) / UC Diagnoses   Final diagnoses:   COVID-19     Discharge Instructions      Advised/instructed patient to take medication as directed with food to completion.  Encourage patient increase daily water intake while taking this medication.  Advised/encouraged patient to self quarantine for 10 days or until Saturday, 01/20/2021.  Advised patient if asymptomatic may self quarantine for 5 days or until Monday, 01/15/2021.     ED Prescriptions     Medication Sig Dispense Auth. Provider   molnupiravir EUA 200 mg CAPS Take 4 capsules (800 mg total) by mouth 2 (two) times daily for 5 days. 40 capsule Trevor Iha, FNP      PDMP not reviewed this encounter.   Trevor Iha, FNP 01/09/21 1713

## 2021-02-04 IMAGING — DX DG HAND COMPLETE 3+V*R*
3 series · 3 of 3 positions shown · non-contrast
Comparison: None.

CLINICAL DATA: Atraumatic right hand pain and swelling.

EXAM:
RIGHT HAND - COMPLETE 3+ VIEW

[hand pa]
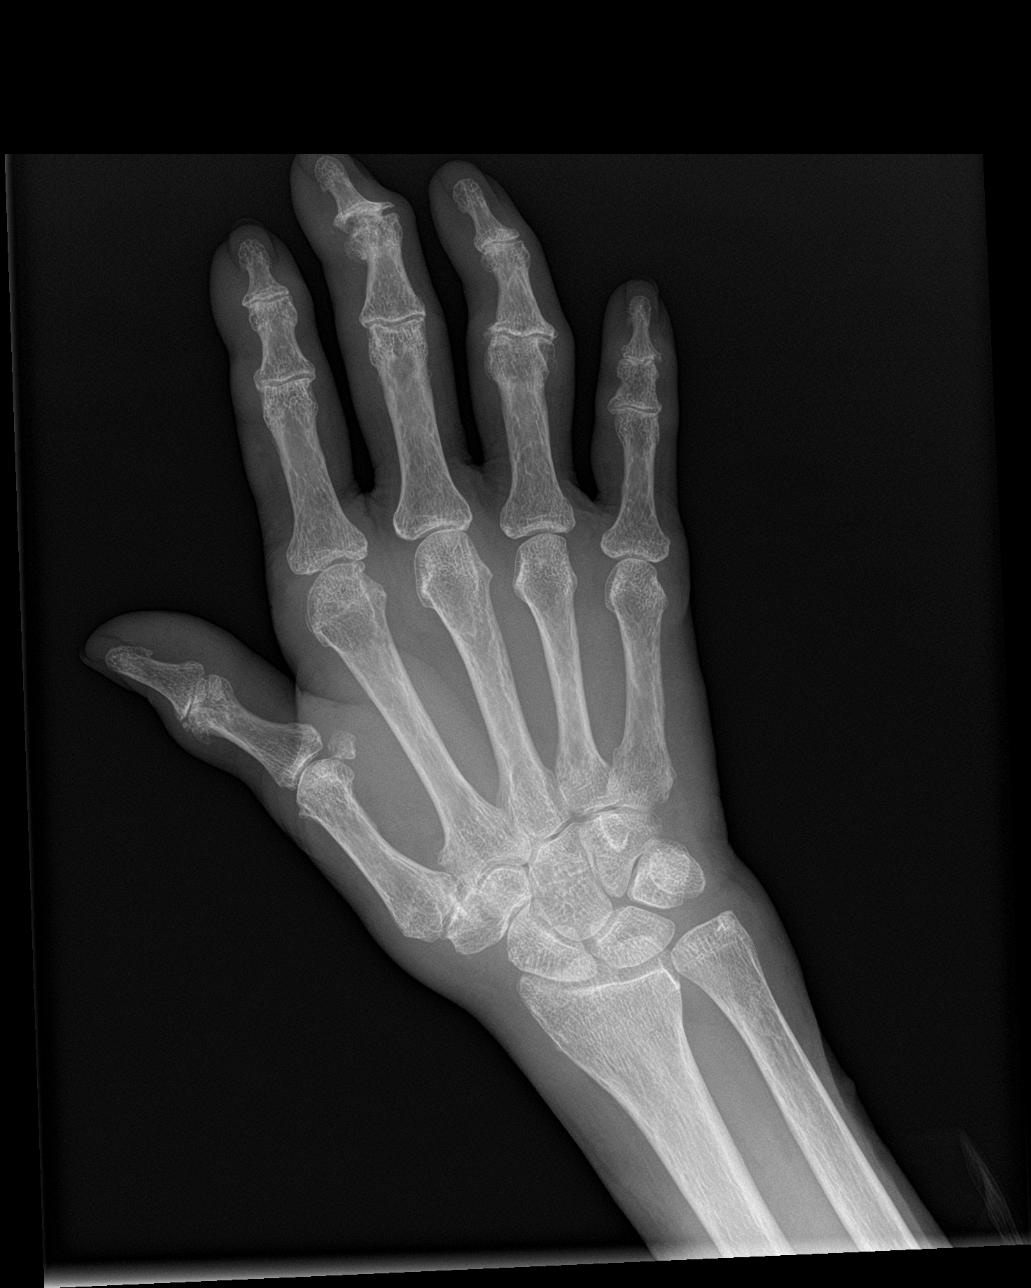

[hand obl]
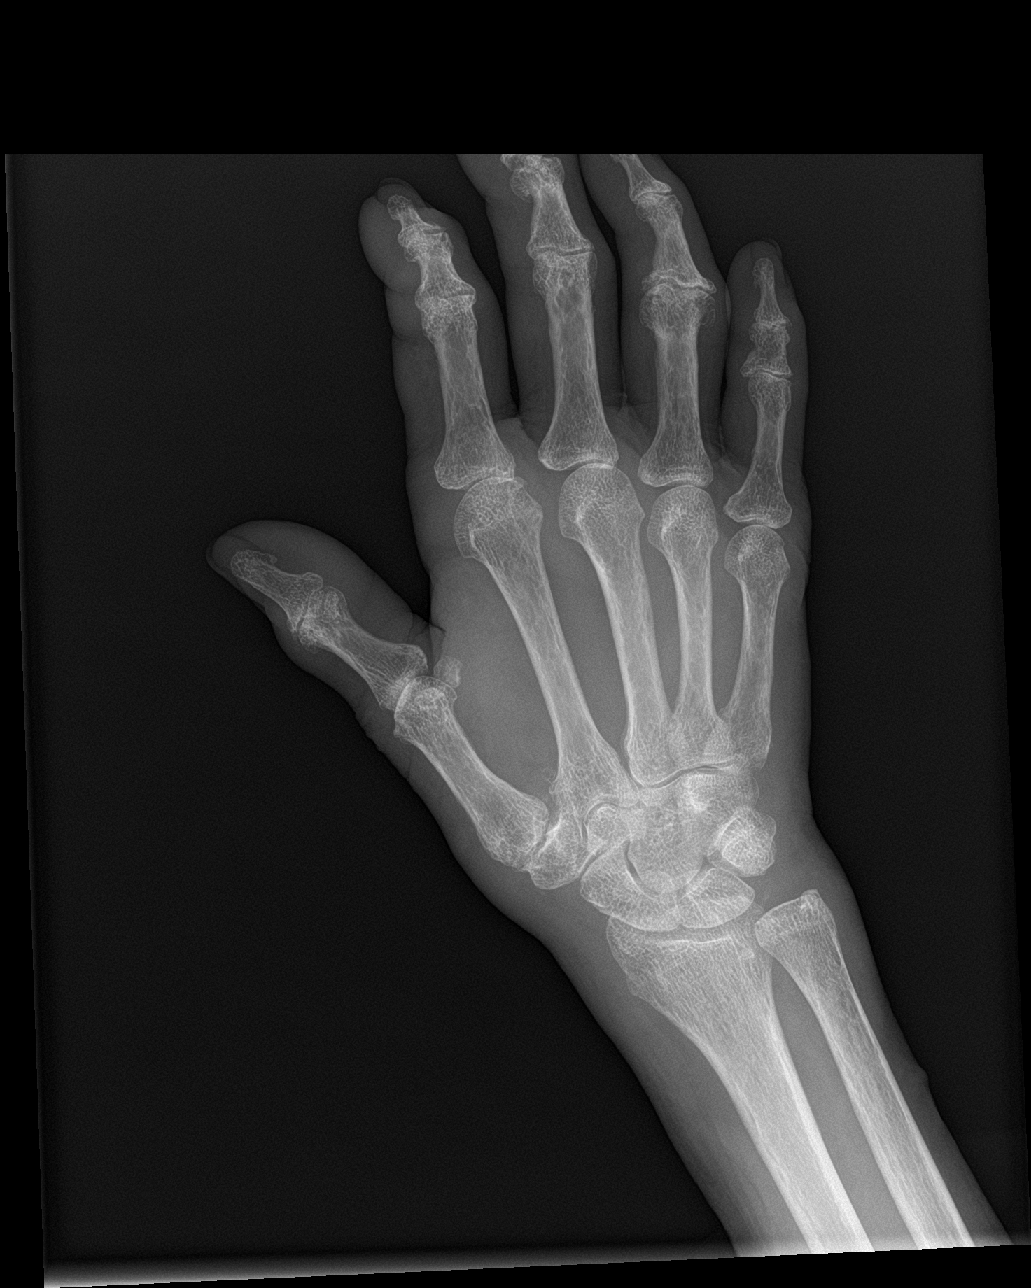

[hand lat]
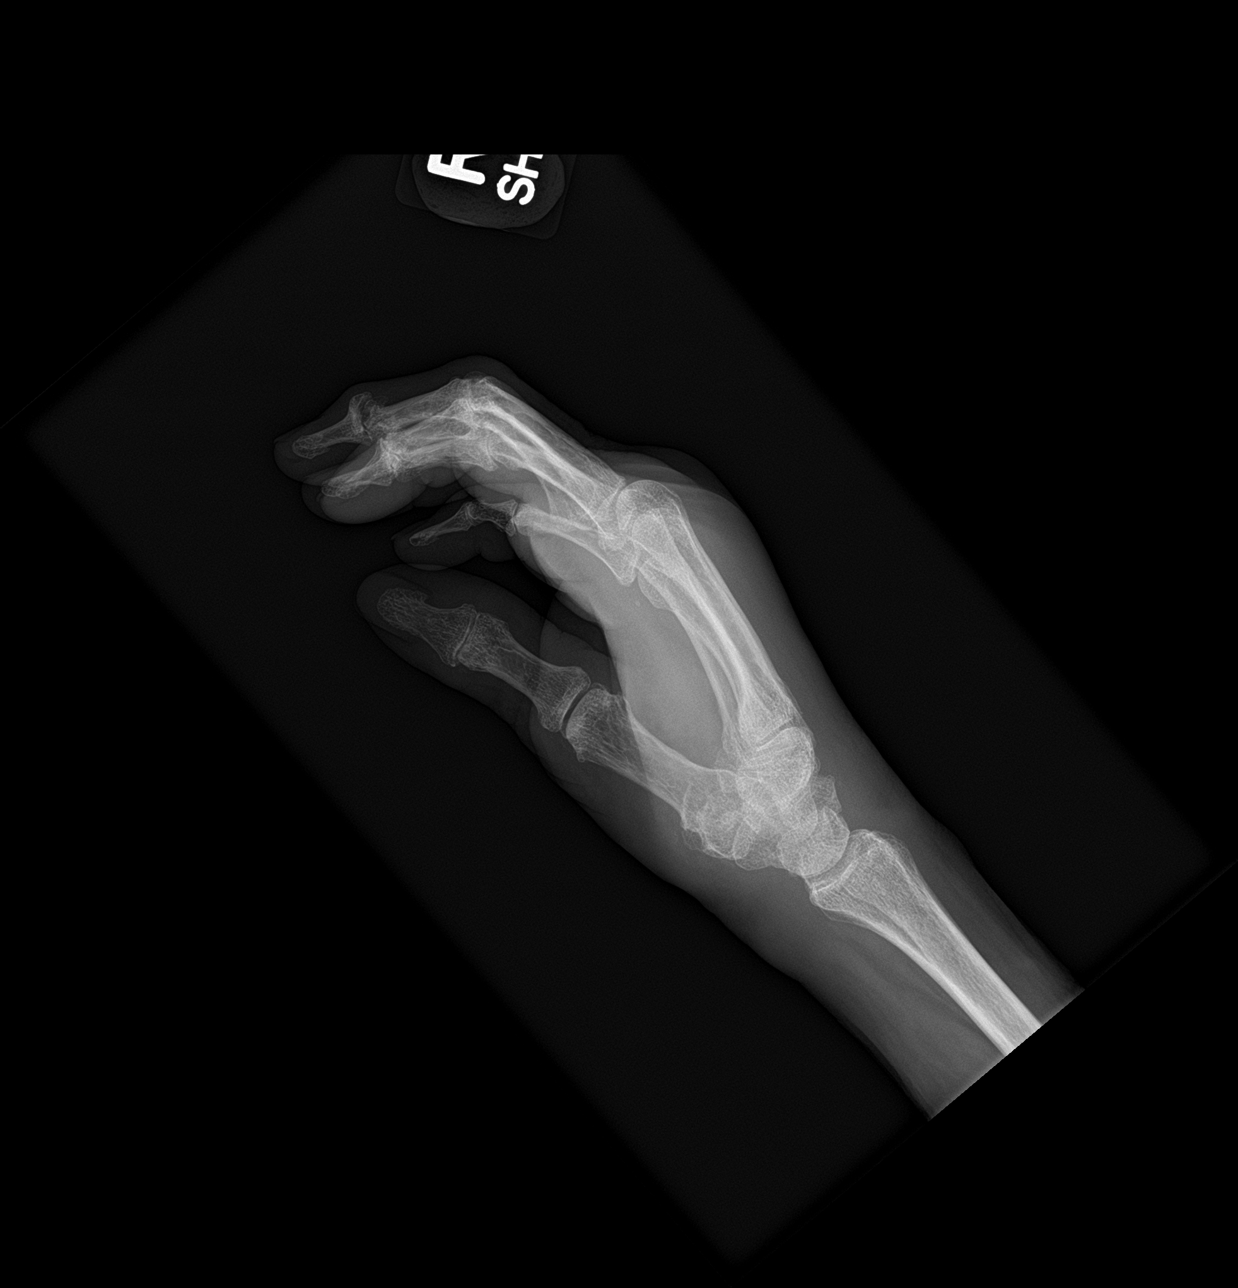

[3 of 3 positions shown; findings below may reference images not displayed]

FINDINGS: Normal visualized carpal bones. Normal first metacarpus. Normal
second through fifth metacarpi.

Chronic deformities are seen involving the distal aspect of the
middle phalanx of the third right finger and base of the distal
phalanx of the third right finger. The remaining phalanges are
normal in appearance.

There is no demonstrated

Fracture.
Normal carpal articulations.
Mild degenerative changes seen involving the carpometacarpal (CMC)
articulation and interphalangeal joint of the right thumb. Normal
metacarpophalangeal (MCP) joint of the right thumb.
Normal second through fifth carpometacarpal (CMC) joints. Normal
second through fifth metacarpophalangeal (MCP) joints. Moderate
severity degenerative changes seen involving the proximal
interphalangeal (PIP) and distal interphalangeal (DIP) joints of the
second through fifth fingers.

Mild dorsal soft tissue swelling is noted.
IMPRESSION: 1. Chronic and degenerative changes, as described above, without
evidence of acute osseous abnormality.

## 2021-05-09 ENCOUNTER — Emergency Department (INDEPENDENT_AMBULATORY_CARE_PROVIDER_SITE_OTHER): Payer: Medicare Other

## 2021-05-09 ENCOUNTER — Emergency Department
Admission: EM | Admit: 2021-05-09 | Discharge: 2021-05-09 | Disposition: A | Discharge status: Home / Self Care | Payer: Medicare Other | Source: Home / Self Care

## 2021-05-09 DIAGNOSIS — M79662 Pain in left lower leg: Secondary | ICD-10-CM

## 2021-05-09 DIAGNOSIS — M25572 Pain in left ankle and joints of left foot: Secondary | ICD-10-CM | POA: Diagnosis not present

## 2021-05-09 DIAGNOSIS — M25472 Effusion, left ankle: Secondary | ICD-10-CM

## 2021-05-09 DIAGNOSIS — M7989 Other specified soft tissue disorders: Secondary | ICD-10-CM

## 2021-05-09 NOTE — ED Notes (Signed)
Report for lower leg ultrasound called to pt. Pt reports she is currently seeing a vascular specialist now, and he is going to review the report. Also advised to f/u with her PCP per M. Ave Filter, FNP.

## 2021-05-09 NOTE — ED Provider Notes (Signed)
[name]    CSN: [id number] Arrival date & time: 05/09/21  1115      History   Chief Complaint Chief Complaint  Patient presents with   Leg Swelling   Ankle Pain    HPI Jarae Nemmers is a 76 y.o. female.   HPI 75 year old female presents with left lower leg/ankle swelling for 3 weeks.  Patient reports pedal edema has been normal for her.  PMH significant for restless leg syndrome, hypertension, CKD, claudication, and mitral valve prolapse.  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      Home Medications    Prior to Admission medications   Medication Sig Start Date End Date Taking? Authorizing Provider  ALPRAZolam Prudy Feeler) 0.5 MG tablet Take 0.5 mg by mouth at bedtime as needed for anxiety.    [provider]  amLODipine (NORVASC) 2.5 MG tablet Take 2.5 mg by mouth daily. Patient not taking: Reported on 06/06/2020    [provider]  ARIPiprazole (ABILIFY) 5 MG tablet Take 5 mg by mouth daily.    [provider]  aspirin 81 MG EC tablet Take by mouth.    [provider]  benzonatate (TESSALON) 200 MG capsule Take 1 capsule (200 mg total) by mouth at bedtime. Take as needed for cough 03/26/16   Lattie Haw, MD  buPROPion (WELLBUTRIN XL) 150 MG 24 hr tablet Take 150 mg by mouth daily.    [provider]  clindamycin (CLEOCIN) 150 MG capsule Take by mouth 3 (three) times daily. Patient not taking: Reported on 06/06/2020    [provider]  Clobetasol Prop Crea-Coal Tar (CLOBETA CREAM EX) Apply topically.    [provider]  Cyanocobalamin 5000 MCG TBDP Take by mouth.    [provider]  dicyclomine (BENTYL) 20 MG tablet Take 20 mg by mouth every 6 (six) hours.    [provider]  doxycycline (VIBRAMYCIN) 100 MG capsule Take 1 capsule (100 mg total) by mouth 2 (two) times daily. Take with food (Rx void after 04/03/16) 05/03/20   Frederica Kuster, MD  FERROUS SULFATE PO Take 240 mg by mouth.    [provider]  fluticasone Aleda Grana) 50 MCG/ACT nasal spray Place two sprays in each nostril once daily 03/26/16   Lattie Haw, MD  GABAPENTIN PO Take by mouth.    [provider]  hydrochlorothiazide (HYDRODIURIL) 25 MG tablet TAKE 1 TABLET(25 MG) BY MOUTH EVERY DAY IN THE MORNING 12/10/19   [provider]  losartan (COZAAR) 25 MG tablet Take 1 tablet by mouth 2 (two) times daily. 12/10/19   [provider]  omeprazole (PRILOSEC) 20 MG capsule Take 20 mg by mouth daily.    [provider]  ondansetron (ZOFRAN) 8 MG tablet  Take by mouth every 8 (eight) hours as needed for nausea or vomiting. Patient not taking: Reported on 06/06/2020    [provider]  sertraline (ZOLOFT) 100 MG tablet Take 100 mg by mouth daily.    [provider]  sulfamethoxazole-trimethoprim (BACTRIM DS) 800-160 MG tablet Take 1 tablet by mouth 2 (two) times daily. Patient not taking: Reported on 06/06/2020 06/02/19   Osie CheeksSofia, Leslie K, PA-C    Family History Family History  Problem Relation Age of Onset   Stroke Mother    Suicidality Father    Healthy Sister    Diabetes Sister    Healthy Sister    Healthy Brother     Social History Social History   Tobacco Use   Smoking status: Never   Smokeless tobacco: Never   Vaping Use   Vaping Use: Never used  Substance Use Topics   Alcohol use: No   Drug use: No     Allergies   Amlodipine, Ceftriaxone, Ciprofloxacin, Codeine, Lisinopril, and Penicillins   Review of Systems Review of Systems  Cardiovascular:  Positive for leg swelling.  All other systems reviewed and are negative.   Physical Exam Triage Vital Signs ED Triage Vitals  Enc Vitals Group     BP 05/09/21 1238 (!) 153/77     Pulse Rate 05/09/21 1238 76     Resp 05/09/21 1238 20     Temp 05/09/21 1238 98 F (36.7 C)     Temp Source 05/09/21 1238 Oral     SpO2 05/09/21 1238 97 %     Weight 05/09/21 1234 134 lb (60.8 kg)     Height 05/09/21 1234 5' 1.5" (1.562 m)     Head Circumference --      Peak Flow --      Pain Score 05/09/21 1232 0     Pain Loc --      Pain Edu? --      Excl. in GC? --    No data found.  Updated Vital Signs BP (!) 153/77 (BP Location: Right Arm)    Pulse 76    Temp 98 F (36.7 C) (Oral)    Resp 20    Ht 5' 1.5" (1.562 m)    Wt 134 lb (60.8 kg)    SpO2 97%    BMI 24.91 kg/m    Physical Exam Vitals and nursing note reviewed.  Constitutional:      General: She is not in acute distress.    Appearance: Normal appearance. She is normal weight. She is not ill-appearing.  HENT:     Head: Normocephalic and atraumatic.     Mouth/Throat:     Mouth: Mucous membranes are moist.     Pharynx: Oropharynx is clear.  Eyes:     Extraocular Movements: Extraocular movements intact.     Conjunctiva/sclera: Conjunctivae normal.     Pupils: Pupils are equal, round, and reactive to light.  Cardiovascular:     Rate and Rhythm: Normal rate and regular rhythm.     Pulses: Normal pulses.     Heart sounds: Normal heart sounds. No murmur heard.   No friction rub. No gallop.  Pulmonary:     Effort: Pulmonary effort is normal.     Breath sounds: Normal breath sounds. No wheezing or rales.  Musculoskeletal:        General: Swelling present.     Comments: Left lower  leg/left ankle (medial aspect): Erythematous, warm to the touch, moderate soft tissue swelling, numerous varicosities  noted; right mid tib circumference 23.0 cm; left mid tib circumference 26.5 cm; PT/DP pulses are palpable  Neurological:     General: No focal deficit present.     Mental Status: She is alert and oriented to person, place, and time. Mental status is at baseline.     UC Treatments / Results  Labs (all labs ordered are listed, but only abnormal results are displayed) Labs Reviewed - No data to display  EKG   Radiology DG Ankle Complete Left  Result Date: 05/09/2021 CLINICAL DATA:  Left ankle pain and swelling.  No injury. EXAM: LEFT ANKLE COMPLETE - 3+ VIEW COMPARISON:  None. FINDINGS: No acute fracture or dislocation. Small well corticated ossific density adjacent to the medial malleolus, likely related to old trauma. The ankle mortise is symmetric. The talar dome is intact. Joint spaces are preserved. Bone mineralization is normal. Small plantar and Achilles enthesophytes. Diffuse soft tissue swelling. IMPRESSION: 1. Diffuse soft tissue swelling. No acute osseous abnormality. Electronically Signed   By: Obie Dredge M.D.   On: 05/09/2021 14:01   US Venous Img Lower Unilateral Left  Result Date: 05/09/2021 CLINICAL DATA:  Left lower extremity pain and swelling for the past 3 weeks. Evaluate for DVT. EXAM: LEFT LOWER EXTREMITY VENOUS DOPPLER ULTRASOUND TECHNIQUE: Gray-scale sonography with graded compression, as well as color Doppler and duplex ultrasound were performed to evaluate the lower extremity deep venous systems from the level of the common femoral vein and including the common femoral, femoral, profunda femoral, popliteal and calf veins including the posterior tibial, peroneal and gastrocnemius veins when visible. The superficial great saphenous vein was also interrogated. Spectral Doppler was utilized to evaluate flow at rest and with distal augmentation maneuvers in  the common femoral, femoral and popliteal veins. COMPARISON:  None. FINDINGS: Contralateral Common Femoral Vein: Respiratory phasicity is normal and symmetric with the symptomatic side. No evidence of thrombus. Normal compressibility. Common Femoral Vein: No evidence of thrombus. Normal compressibility, respiratory phasicity and response to augmentation. Saphenofemoral Junction: No evidence of thrombus. Normal compressibility and flow on color Doppler imaging. Profunda Femoral Vein: No evidence of thrombus. Normal compressibility and flow on color Doppler imaging. Femoral Vein: No evidence of thrombus. Normal compressibility, respiratory phasicity and response to augmentation. Popliteal Vein: No evidence of thrombus. Normal compressibility, respiratory phasicity and response to augmentation. Calf Veins: No evidence of thrombus. Normal compressibility and flow on color Doppler imaging. Superficial Great Saphenous Vein: No evidence of thrombus. Normal compressibility. Venous Reflux:  None. Other Findings: There is age-indeterminate mixed echogenic occlusive thrombus within the left lesser saphenous vein (images 36 through 38 and image 42). IMPRESSION: 1. No evidence of DVT within the left lower extremity. 2. Examination is positive for age-indeterminate occlusive superficial thrombophlebitis involving the left lesser saphenous vein at the level of the calf. While this occlusive SVT could be chronic in etiology, in the absence of prior examinations, an acute on chronic process is not excluded. Clinical correlation is advised. Electronically Signed   By: Simonne Come M.D.   On: 05/09/2021 14:39    Procedures Procedures (including critical care time)  Medications Ordered in UC Medications - No data to display  Initial Impression / Assessment and Plan / UC Course  I have reviewed the triage vital signs and the nursing notes.  Pertinent labs & imaging results that were available during my care of the patient were  reviewed by me and considered in my medical decision making (see chart for details).  MDM: 1.  Pain and swelling of left lower leg-advised patient x-ray revealed no abnormality other than tiny Plantar and Achilles enthesophytes, patient had doctor's appointment at 3 today and left prior to completion of US venous lower unilateral left Doppler revealing complete/read. Advised/informed patient of left ankle x-ray results above.  Advised staff RN to contact/call patient and review conclusion of unilateral/left lower extremity US Doppler: No evidence of DVT within the left lower extremity.  Examination is positive for age-indeterminate occlusive superficial thrombophlebitis involving the lesser saphenous vein at level of the calf.  While this occlusive SVT could be chronic in etiology in absence of prior examination and acute on chronic processes not excluded.  Clinical correlation is advised.  Given history of claudication advised patient to follow-up with her PCP for possible vascular consult.  Patient was discharged home earlier, hemodynamically stable. Final Clinical Impressions(s) / UC Diagnoses   Final diagnoses:  Pain and swelling of lower leg, left  Left ankle swelling     Discharge Instructions      Advised/informed patient of left ankle x-ray results above.  Advised staff RN to contact patient and review conclusion of unilateral/left lower extremity US Doppler: No evidence of DVT within the left lower extremity.  Examination is positive for age-indeterminate occlusive superficial thrombophlebitis involving the lesser saphenous vein at level of the calf.  While this occlusive SVT could be chronic in etiology in absence of prior examination and acute on chronic processes not excluded.  Clinical correlation is advised.  Given history of claudication advised patient to follow-up with her PCP for possible vascular consult.     ED Prescriptions   None    PDMP not reviewed this  encounter.   Trevor IhaRagan, Velna Hedgecock, FNP 05/09/21 1520

## 2021-05-09 NOTE — Discharge Instructions (Addendum)
Advised/informed patient of left ankle x-ray results above.  Advised staff RN to contact patient and review conclusion of unilateral/left lower extremity US Doppler: No evidence of DVT within the left lower extremity.  Examination is positive for age-indeterminate occlusive superficial thrombophlebitis involving the lesser saphenous vein at level of the calf.  While this occlusive SVT could be chronic in etiology in absence of prior examination and acute on chronic processes not excluded.  Clinical correlation is advised.  Given history of claudication advised patient to follow-up with her PCP for possible vascular consult.

## 2021-05-09 NOTE — ED Triage Notes (Signed)
Pt presents to Urgent Care with c/o L lower leg/ankle swelling x "a few weeks." Reports pain to L medial ankle. Swelling to both feet (pt reports is normal for her) and L lower leg noted. No known injury per pt.

## 2021-08-27 ENCOUNTER — Emergency Department
Admission: EM | Admit: 2021-08-27 | Discharge: 2021-08-27 | Disposition: A | Discharge status: Home / Self Care | Payer: Medicare Other | Source: Home / Self Care | Attending: Family Medicine | Admitting: Family Medicine

## 2021-08-27 ENCOUNTER — Emergency Department (INDEPENDENT_AMBULATORY_CARE_PROVIDER_SITE_OTHER): Payer: Medicare Other

## 2021-08-27 DIAGNOSIS — M25561 Pain in right knee: Secondary | ICD-10-CM

## 2021-08-27 DIAGNOSIS — M1120 Other chondrocalcinosis, unspecified site: Secondary | ICD-10-CM

## 2021-08-27 DIAGNOSIS — M79661 Pain in right lower leg: Secondary | ICD-10-CM

## 2021-08-27 DIAGNOSIS — M25461 Effusion, right knee: Secondary | ICD-10-CM

## 2021-08-27 MED ORDER — PREDNISONE 20 MG PO TABS: ORAL_TABLET | ORAL | 0 refills | Status: AC

## 2021-08-27 NOTE — Discharge Instructions (Addendum)
Apply ice pack for 20 to 30 minutes, 3 to 4 times daily  Continue until pain and swelling decrease.   May take Tylenol as needed for pain.  Use a cane for stability. ?

## 2021-08-27 NOTE — ED Provider Notes (Signed)
?[facility] ? ? ? ?CSN: [id number] ?Arrival date & time: 08/27/21  1056 ? ? ?  ? ?History   ?Chief Complaint ?Chief Complaint  ?Patient presents with  ? Leg Swelling  ? ? ?HPI ?Stacy Gross is a 76 y.o. female.  ? ?Three days ago after suddenly arising from a chair, patient developed pain in her right knee. She has gradually developed swelling in her right knee followed by painless swelling in her right leg below the knee.  She has persistent pain with knee flexion/extension and weight bearing.  She denies trauma to her knee.  She denies shortness of breath or chest pain, and has no past history of DVT. ? ?The history is provided by the patient.  ?Knee Pain ?Location:  Knee ?Time since incident:  3 days ?Injury: no   ?Knee location:  R knee ?Pain details:  ?  Quality:  Aching ?  Radiates to:  Does not radiate ?  Severity:  Moderate ?  Onset quality:  Sudden ?  Duration:  3 days ?  Timing:  Constant ?  Progression:  Unchanged ?Chronicity:  New ?Prior injury to area:  No ?Relieved by:  Nothing ?Worsened by:  Bearing weight, extension and flexion ?Ineffective treatments:  Compression ?Associated symptoms: decreased ROM, stiffness and swelling   ?Associated symptoms: no fatigue, no fever, no muscle weakness and no numbness   ? ?Past Medical History:  ?Diagnosis Date  ? Acid reflux   ? Anxiety and depression   ? Arthritis   ? Hypertension   ? Kidney disease   ? Mitral valve prolapse   ? Psoriasis   ? RLS (restless legs syndrome)   ? ? ?Patient Active Problem List  ? Diagnosis Date Noted  ? Osteoarthritis 02/08/2020  ? Constipation 08/28/2017  ? Abnormal ECG 11/11/2016  ? 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 Spondylosis of cervical region without myelopathy or radiculopathy 07/05/2016  ? Chronic right shoulder pain 03/12/2016  ? Essential hypertension 10/09/2011  ? ? ?Past Surgical History:  ?Procedure Laterality Date  ? BACK SURGERY    ? ? ?OB History   ?No obstetric history on file. ?  ? ? ? ?Home  Medications   ? ?Prior to Admission medications   ?Medication Sig Start Date End Date Taking? Authorizing Provider  ?predniSONE (DELTASONE) 20 MG tablet Take one tab by mouth twice daily for 4 days, then one daily. Take with food. 08/27/21  Yes Lattie Haw, MD  ?ALPRAZolam Prudy Feeler) 0.5 MG tablet Take 0.5 mg by mouth at bedtime as needed for anxiety.    [provider]  ?amLODipine (NORVASC) 2.5 MG tablet Take 2.5 mg by mouth daily. ?Patient not taking: Reported on 06/06/2020    [provider]  ?ARIPiprazole (ABILIFY) 5 MG tablet Take 5 mg by mouth daily.    [provider]  ?aspirin 81 MG EC tablet Take by mouth.    [provider]  ?benzonatate (TESSALON) 200 MG capsule Take 1 capsule (200 mg total) by mouth at bedtime. Take as needed for cough 03/26/16   Lattie Haw, MD  ?buPROPion (WELLBUTRIN XL) 150 MG 24 hr tablet Take 150 mg by mouth daily.    [provider]  ?clindamycin (CLEOCIN) 150 MG capsule Take by mouth 3 (three) times daily. ?Patient not taking: Reported on 06/06/2020    [provider]  ?Clobetasol Prop Crea-Coal Tar (CLOBETA CREAM EX) Apply topically.    [provider]  ?Cyanocobalamin 5000 MCG TBDP  Take by mouth.    [provider]  ?dicyclomine (BENTYL) 20 MG tablet Take 20 mg by mouth every 6 (six) hours.    [provider]  ?doxycycline (VIBRAMYCIN) 100 MG capsule Take 1 capsule (100 mg total) by mouth 2 (two) times daily. Take with food (Rx void after 04/03/16) ?Patient not taking: Reported on 08/27/2021 05/03/20   Frederica KusterMiller, Vara Mairena M, MD  ?FERROUS SULFATE PO Take 240 mg by mouth.    [provider]  ?fluticasone Aleda Grana(FLONASE) 50 MCG/ACT nasal spray Place two sprays in each nostril once daily 03/26/16   Lattie HawBeese, Harwood Nall A, MD  ?GABAPENTIN PO Take by mouth.    [provider]  ?hydrochlorothiazide (HYDRODIURIL) 25 MG tablet TAKE 1 TABLET(25 MG) BY MOUTH EVERY DAY IN THE MORNING 12/10/19   [provider]  ?losartan (COZAAR) 25 MG tablet Take 1 tablet by mouth 2 (two) times daily. 12/10/19   [provider]  ?omeprazole (PRILOSEC) 20 MG capsule Take 20 mg by mouth daily.    [provider]  ?ondansetron (ZOFRAN) 8 MG tablet Take by mouth every 8 (eight) hours as needed for nausea or vomiting. ?Patient not taking: Reported on 06/06/2020    [provider]  ?sertraline (ZOLOFT) 100 MG tablet Take 100 mg by mouth daily.    [provider]  ?sulfamethoxazole-trimethoprim (BACTRIM DS) 800-160 MG tablet Take 1 tablet by mouth 2 (two) times daily. ?Patient not taking: Reported on 06/06/2020 06/02/19   Elson AreasSofia, Leslie K, PA-C  ? ? ?Family History ?Family History  ?Problem Relation Age of Onset  ? Stroke Mother   ? Suicidality Father   ? Healthy Sister   ? Diabetes Sister   ? Healthy Sister   ? Healthy Brother   ? ? ?Social History ?Social History  ? ?Tobacco Use  ? Smoking status: Never  ? Smokeless tobacco: Never  ?Vaping Use  ? Vaping Use: Never used  ?Substance Use Topics  ? Alcohol use: No  ? Drug use: No  ? ? ? ?Allergies   ?Amlodipine, Ceftriaxone, Ciprofloxacin, Codeine, Lisinopril, and Penicillins ? ? ?Review of Systems ?Review of Systems  ?Constitutional:  Negative for chills, diaphoresis, fatigue and fever.  ?Respiratory:  Negative for cough, chest tightness and shortness of breath.   ?Cardiovascular:  Negative for chest pain.  ?Musculoskeletal:  Positive for joint swelling and stiffness.  ?All other systems reviewed and are negative. ? ? ?Physical Exam ?Triage Vital Signs ?ED Triage Vitals  ?Enc Vitals Group  ?   BP 08/27/21 1109 (!) 150/68  ?   Pulse Rate 08/27/21 1109 76  ?   Resp 08/27/21 1109 14  ?   Temp 08/27/21 1109 98.1 ?F (36.7 ?C)  ?   Temp Source 08/27/21 1109 Oral  ?   SpO2 08/27/21 1109 97 %  ?   Weight --   ?   Height --   ?   Head Circumference --   ?   Peak Flow --   ?   Pain Score 08/27/21 1111 10  ?   Pain Loc --   ?   Pain Edu? --   ?   Excl. in GC? --    ? ?No data found. ? ?Updated Vital Signs ?BP (!) 150/68 (BP Location: Left Arm)   Pulse 76   Temp 98.1 ?F (36.7 ?C) (Oral)   Resp 14   SpO2 97%  ? ?Visual Acuity ?Right Eye Distance:   ?Left Eye Distance:   ?Bilateral Distance:   ? ?  Right Eye Near:   ?Left Eye Near:    ?Bilateral Near:    ? ?Physical Exam ?Vitals and nursing note reviewed.  ?Constitutional:   ?   General: She is not in acute distress. ?HENT:  ?   Head: Normocephalic.  ?Eyes:  ?   Conjunctiva/sclera: Conjunctivae normal.  ?   Pupils: Pupils are equal, round, and reactive to light.  ?Cardiovascular:  ?   Rate and Rhythm: Normal rate and regular rhythm.  ?   Heart sounds: Normal heart sounds.  ?Pulmonary:  ?   Breath sounds: Normal breath sounds.  ?Abdominal:  ?   Palpations: Abdomen is soft.  ?   Tenderness: There is no abdominal tenderness.  ?Musculoskeletal:     ?   General: Swelling and tenderness present.  ?   Right knee: Swelling and bony tenderness present. No erythema, ecchymosis or crepitus. Decreased range of motion. Tenderness present over the medial joint line. No LCL laxity, MCL laxity, ACL laxity or PCL laxity. Normal meniscus.  ?   Right lower leg: Swelling present.  ?     Legs: ? ?   Comments: Right lower leg below the knee tense and swollen but only mildly tender to palpation. ?Right knee diffusely tender to palpation, with decreased range of motion.  ?Skin: ?   General: Skin is warm and dry.  ?   Findings: No rash.  ?Neurological:  ?   General: No focal deficit present.  ?   Mental Status: She is alert.  ? ? ? ?UC Treatments / Results  ?Labs ?(all labs ordered are listed, but only abnormal results are displayed) ?Labs Reviewed - No data to display ? ?EKG ? ? ?Radiology ?US Venous Img Lower Unilateral Right ? ?Result Date: 08/27/2021 ?CLINICAL DATA:  Pain and swelling in the right knee and right lower leg EXAM: Right LOWER EXTREMITY VENOUS DOPPLER ULTRASOUND TECHNIQUE: Gray-scale sonography with compression, as well as color and  duplex ultrasound, were performed to evaluate the deep venous system(s) from the level of the common femoral vein through the popliteal and proximal calf veins. COMPARISON:  None. FINDINGS: VENOUS Normal compr

## 2021-08-27 NOTE — ED Triage Notes (Signed)
Pt presents with rt leg pain and swelling that began saturday ?

## 2021-10-04 ENCOUNTER — Emergency Department
Admission: EM | Admit: 2021-10-04 | Discharge: 2021-10-04 | Discharge status: Against Medical Advice | Payer: Medicare Other | Source: Home / Self Care
